# Patient Record
Sex: Male | Born: 1980 | Race: White | Hispanic: No | Marital: Single | Smoking: Never smoker
Health system: Southern US, Community
[De-identification: ages and names within clinical notes are randomized; demographics above are authoritative.]

---
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
## 2003-10-25 ENCOUNTER — Ambulatory Visit (HOSPITAL_BASED_OUTPATIENT_CLINIC_OR_DEPARTMENT_OTHER): Admission: RE | Admit: 2003-10-25 | Discharge: 2003-10-25 | Payer: Self-pay

## 2003-10-25 ENCOUNTER — Ambulatory Visit (HOSPITAL_COMMUNITY): Admission: RE | Admit: 2003-10-25 | Discharge: 2003-10-25 | Payer: Self-pay

## 2016-12-07 ENCOUNTER — Encounter (HOSPITAL_BASED_OUTPATIENT_CLINIC_OR_DEPARTMENT_OTHER): Payer: Self-pay | Admitting: Emergency Medicine

## 2016-12-07 ENCOUNTER — Emergency Department (HOSPITAL_BASED_OUTPATIENT_CLINIC_OR_DEPARTMENT_OTHER): Payer: Managed Care, Other (non HMO)

## 2016-12-07 ENCOUNTER — Emergency Department (HOSPITAL_BASED_OUTPATIENT_CLINIC_OR_DEPARTMENT_OTHER)
Admission: EM | Admit: 2016-12-07 | Discharge: 2016-12-07 | Disposition: A | Discharge status: Home / Self Care | Payer: Managed Care, Other (non HMO) | Attending: Emergency Medicine | Admitting: Emergency Medicine

## 2016-12-07 DIAGNOSIS — R1031 Right lower quadrant pain: Secondary | ICD-10-CM | POA: Diagnosis not present

## 2016-12-07 LAB — CBC WITH DIFFERENTIAL/PLATELET
BASOS ABS: 0 10*3/uL (ref 0.0–0.1)
Basophils Relative: 1 %
EOS PCT: 7 %
Eosinophils Absolute: 0.3 10*3/uL (ref 0.0–0.7)
HEMATOCRIT: 40.9 % (ref 39.0–52.0)
HEMOGLOBIN: 14.3 g/dL (ref 13.0–17.0)
LYMPHS ABS: 1 10*3/uL (ref 0.7–4.0)
LYMPHS PCT: 25 %
MCH: 31.8 pg (ref 26.0–34.0)
MCHC: 35 g/dL (ref 30.0–36.0)
MCV: 91.1 fL (ref 78.0–100.0)
Monocytes Absolute: 0.4 10*3/uL (ref 0.1–1.0)
Monocytes Relative: 11 %
NEUTROS ABS: 2.2 10*3/uL (ref 1.7–7.7)
NEUTROS PCT: 56 %
Platelets: 149 10*3/uL — ABNORMAL LOW (ref 150–400)
RBC: 4.49 MIL/uL (ref 4.22–5.81)
RDW: 12 % (ref 11.5–15.5)
WBC: 3.8 10*3/uL — AB (ref 4.0–10.5)

## 2016-12-07 LAB — BASIC METABOLIC PANEL
ANION GAP: 7 (ref 5–15)
BUN: 21 mg/dL — ABNORMAL HIGH (ref 6–20)
CHLORIDE: 104 mmol/L (ref 101–111)
CO2: 28 mmol/L (ref 22–32)
Calcium: 9.7 mg/dL (ref 8.9–10.3)
Creatinine, Ser: 0.99 mg/dL (ref 0.61–1.24)
GFR calc Af Amer: >60 mL/min (ref >60)
Glucose, Bld: 93 mg/dL (ref 65–99)
Potassium: 4.1 mmol/L (ref 3.5–5.1)
SODIUM: 139 mmol/L (ref 135–145)

## 2016-12-07 MED ORDER — IOPAMIDOL (ISOVUE-300) INJECTION 61%
100.0000 mL | Freq: Once | INTRAVENOUS | Status: AC | PRN
  Administered 2016-12-07: 100 mL via INTRAVENOUS

## 2016-12-07 NOTE — Discharge Instructions (Addendum)
Read the information below.  You may return to the Emergency Department at any time for worsening condition or any new symptoms that concern you.  If you develop high fevers, worsening abdominal pain, uncontrolled vomiting, or are unable to tolerate fluids by mouth, return to the ER for a recheck.   °

## 2016-12-07 NOTE — ED Provider Notes (Signed)
Sign out from [place] PA-C  Sent here to r/o appenidicitis  2-3 weeks of RLQ pain radiating to R testicle, associated nausea; intermittent  McBurney's point tenderness  CTAP, urinalysis pending  If negative, discharge home  Urine was negative at urgent care. CT abdomen pelvis is also negative for acute abnormality. Patient to follow up with PCP if symptoms are continuing. Strict return precautions discussed. Patient understands and agrees with plan. Patient vitals stable throughout ED course discharged in satisfactory condition.   Emi HolesLaw, Tory Septer M, PA-C 12/07/16 1800    Arby BarrettePfeiffer, Marcy, MD 12/07/16 2328

## 2016-12-07 NOTE — ED Triage Notes (Signed)
Patient states that he went to [facility] urgent care and they sent him here to be evaluated for possible Appendacitis. The patient reports that he has had right lower quadrant pain x 2 -3 weeks, with nausea. Getting worse every day

## 2016-12-07 NOTE — ED Provider Notes (Signed)
[facility] Note   CSN: [id number] Arrival date & time: 12/07/16  1348     History   Chief Complaint Chief Complaint  Patient presents with  . Abdominal Pain    HPI Bobby Gay is a 36 y.o. male.  HPI   Patient sent from urgent care to rule out appendicitis.  Pt with RLQ abdominal pain x 2-3 weeks, associated nausea.  Pain radiates into the right testicle.  Worse with activity.  Denies fevers, chills, myalgias, N/V, bowel changes, urinary symptoms, testicular swelling, penile discharge, genital lesions.  Has past surgical history of left inguinal hernia repair.    History reviewed. No pertinent past medical history.  There are no active problems to display for this patient.   History reviewed. No pertinent surgical history.     Home Medications    Prior to Admission medications   Not on File    Family History History reviewed. No pertinent family history.  Social History Social History  Substance Use Topics  . Smoking status: Never Smoker  . Smokeless tobacco: Never Used  . Alcohol use No     Allergies   Patient has no known allergies.   Review of Systems Review of Systems  All other systems reviewed and are negative.    Physical Exam Updated Vital Signs BP 111/68 (BP Location: Right Arm)   Pulse 64   Temp 98.3 F (36.8 C) (Oral)   Resp 15   Ht 6\' 1"  (1.854 m)   Wt 67.6 kg (149 lb)   SpO2 100%   BMI 19.66 kg/m   Physical Exam  Constitutional: He appears well-developed and well-nourished. No distress.  HENT:  Head: Normocephalic and atraumatic.  Neck: Neck supple.  Cardiovascular: Normal rate and regular rhythm.   Pulmonary/Chest: Effort normal and breath sounds normal. No respiratory distress. He has no wheezes. He has no rales.  Abdominal: Soft. He exhibits no distension and no mass. There is tenderness in the right lower quadrant. There is tenderness at McBurney's point. There is no rebound and no  guarding.  Genitourinary: Testes normal. Right testis shows no mass, no swelling and no tenderness. Right testis is descended. Left testis shows no mass, no swelling and no tenderness. Left testis is descended.  Neurological: He is alert. He exhibits normal muscle tone.  Skin: He is not diaphoretic.  Nursing note and vitals reviewed.    ED Treatments / Results  Labs (all labs ordered are listed, but only abnormal results are displayed) Labs Reviewed  BASIC METABOLIC PANEL - Abnormal; Notable for the following:       Result Value   BUN 21 (*)    All other components within normal limits  CBC WITH DIFFERENTIAL/PLATELET - Abnormal; Notable for the following:    WBC 3.8 (*)    Platelets 149 (*)    All other components within normal limits  URINALYSIS, ROUTINE W REFLEX MICROSCOPIC    EKG  EKG Interpretation None       Radiology No results found.  Procedures Procedures (including critical care time)  Medications Ordered in ED Medications  iopamidol (ISOVUE-300) 61 % injection 100 mL (100 mLs Intravenous Contrast Given 12/07/16 1702)     Initial Impression / Assessment and Plan / ED Course  I have reviewed the triage vital signs and the nursing notes.  Pertinent labs & imaging results that were available during my care of the patient were reviewed by me and considered in my medical decision making (see chart  for details).     Afebrile nontoxic patient with 2-3 weeks of RLQ abdominal pain, nausea.  No other symptoms.  No fevers.  Labs unremarkable.  UA, CT abd/pelvis pending at change of shift.  Glenford Bayley, PA-C, assumes care pending remainder of workup.    Final Clinical Impressions(s) / ED Diagnoses   Final diagnoses:  Right lower quadrant abdominal pain    New Prescriptions New Prescriptions   No medications on file     Trixie Dredge, Cordelia Poche 12/07/16 1710    Blane Ohara, MD 12/10/16 [phone]

## 2018-06-16 IMAGING — CT CT ABD-PELV W/ CM
2 of 4 series · 16 of 46 positions shown, 18 images · IV contrast (APPLIED)
Comparison: None.

CLINICAL DATA: Right lower quadrant abdominal pain.

EXAM:
CT ABDOMEN AND PELVIS WITH CONTRAST
TECHNIQUE: Multidetector CT imaging of the abdomen and pelvis was performed
using the standard protocol following bolus administration of
intravenous contrast.
CONTRAST:  100mL [id number] IOPAMIDOL (CA1366-WJJ) INJECTION 61%

[Series 2: axial st · axial · 0.74mm/px · z∈[+638,+1048]mm · 13 of 90 slices shown, 15 images]
[im 4/90  soft-tissue]
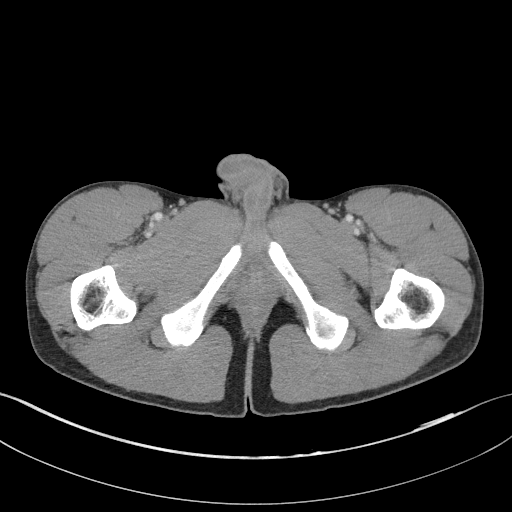
[im 4/90  bone]
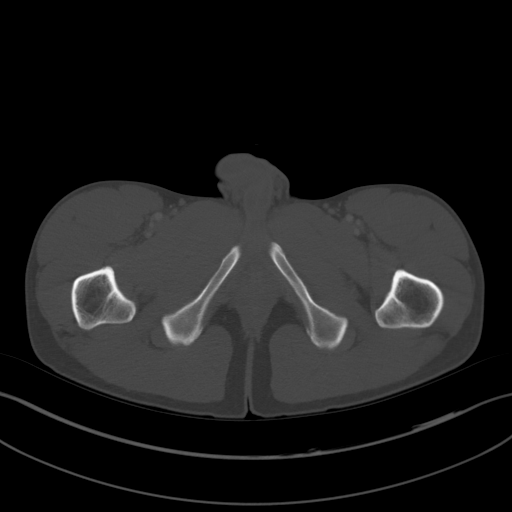
[im 12/90  soft-tissue]
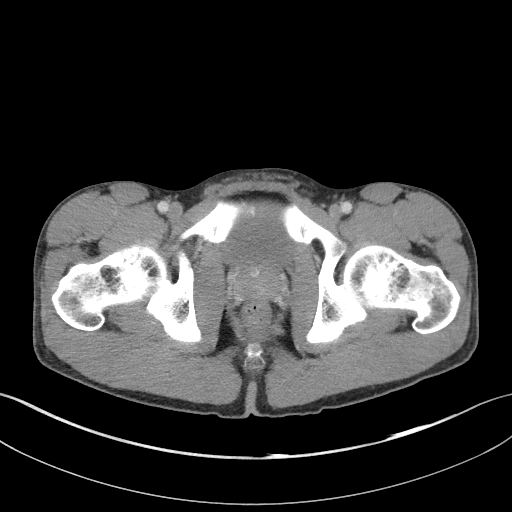
[im 19/90  soft-tissue]
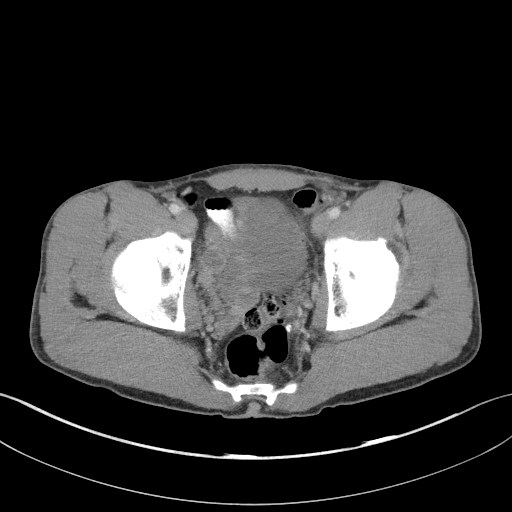
[im 26/90  soft-tissue]
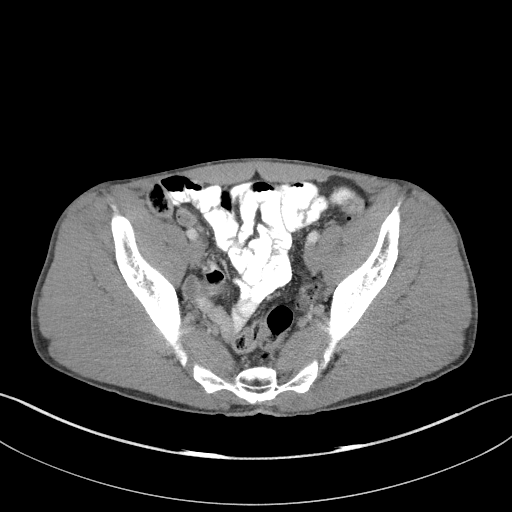
[im 30/90  soft-tissue]
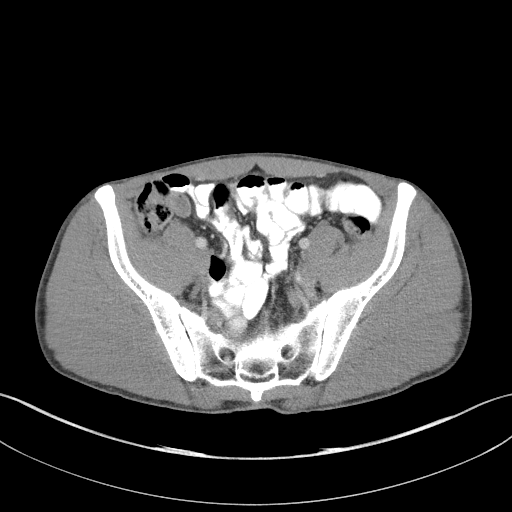
[im 38/90  soft-tissue]
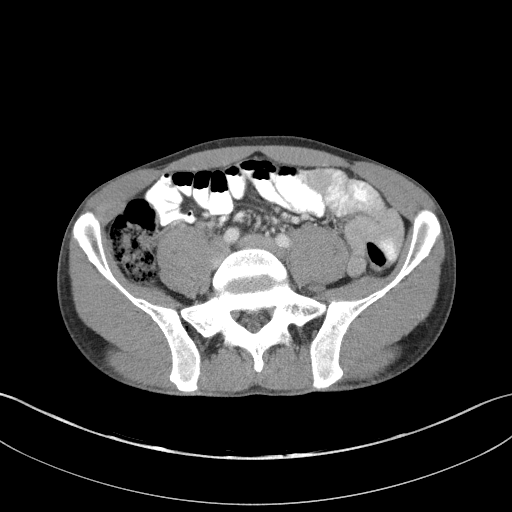
[im 45/90  soft-tissue]
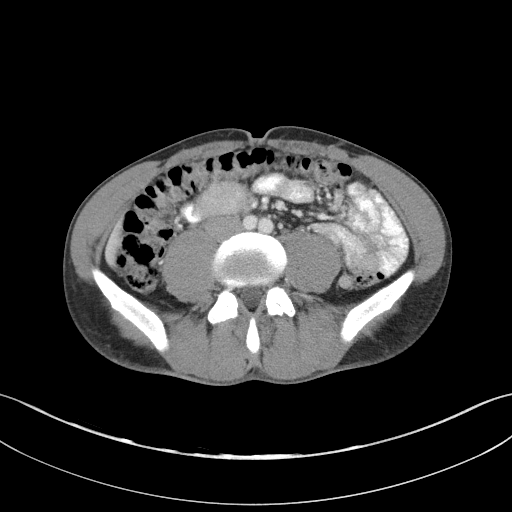
[im 52/90  soft-tissue]
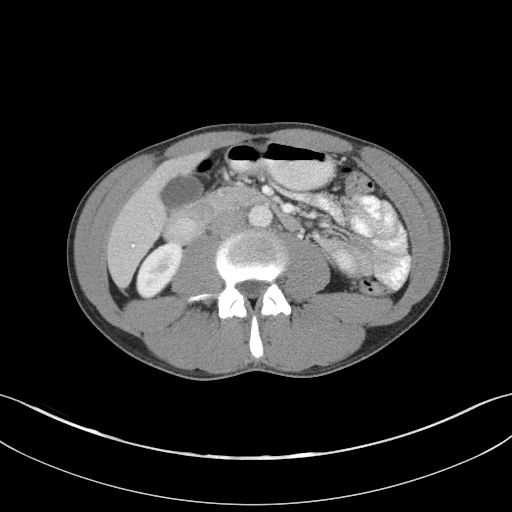
[im 60/90  soft-tissue]
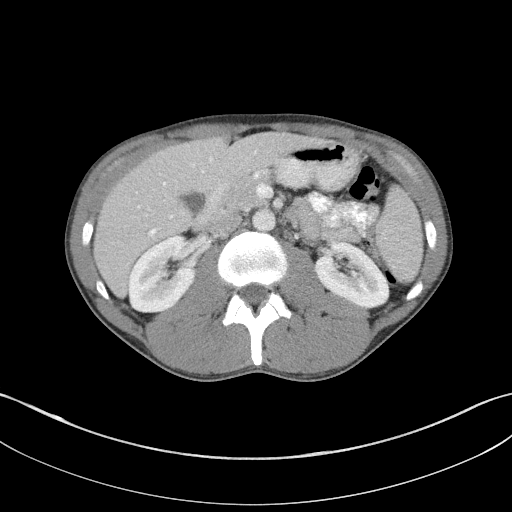
[im 60/90  bone]
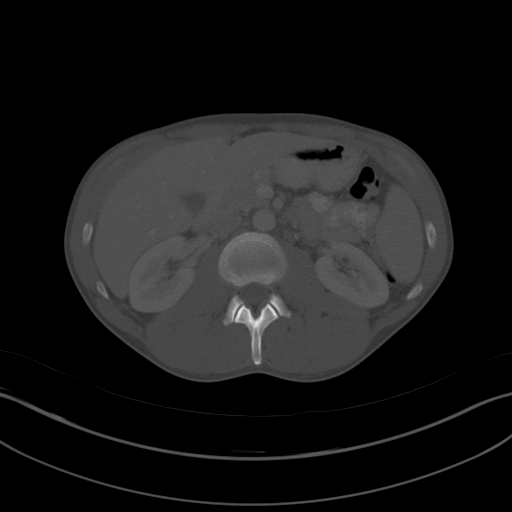
[im 64/90  soft-tissue]
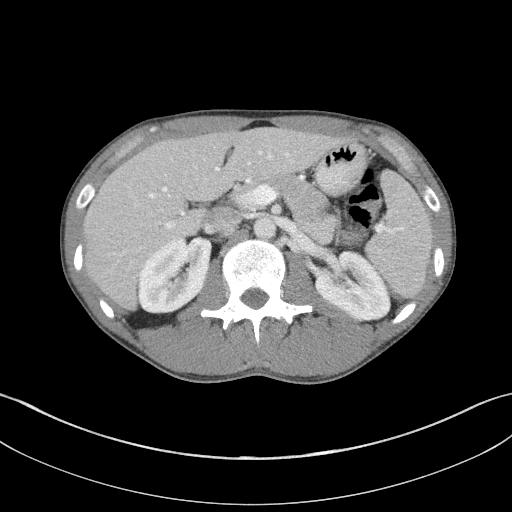
[im 71/90  soft-tissue]
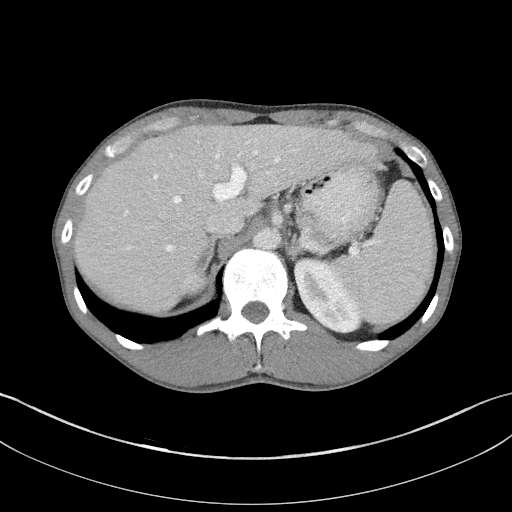
[im 78/90  soft-tissue]
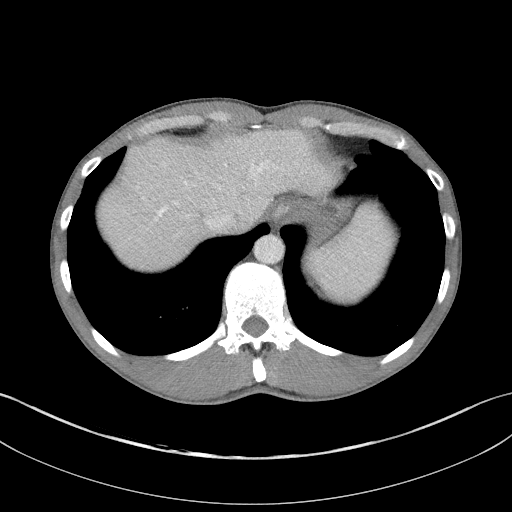
[im 86/90  soft-tissue]
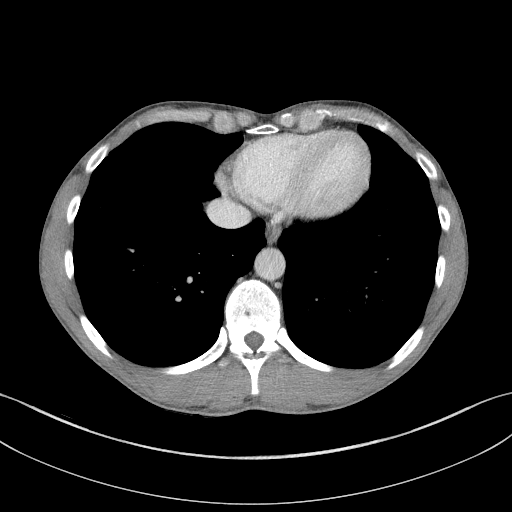

[Series 5: coronal st · coronal · 0.73mm/px · 3 of 71 slices shown]
[im 24/71  soft-tissue]
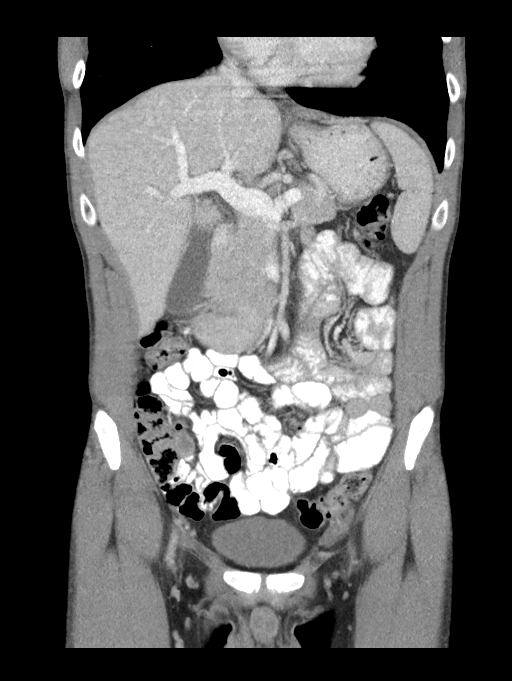
[im 32/71  soft-tissue]
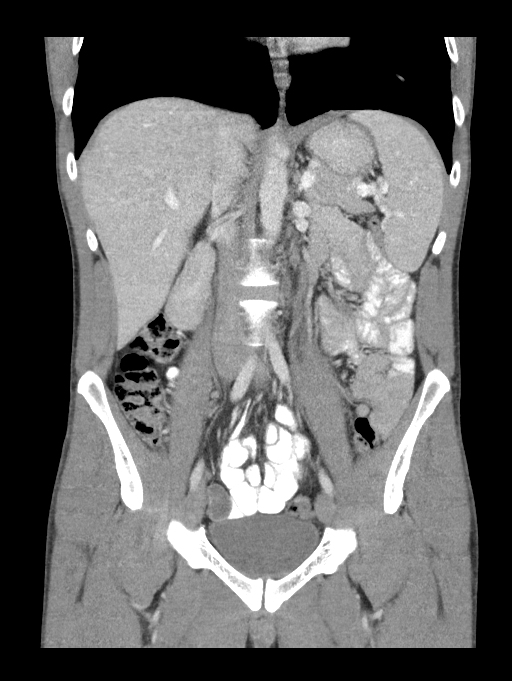
[im 39/71  soft-tissue]
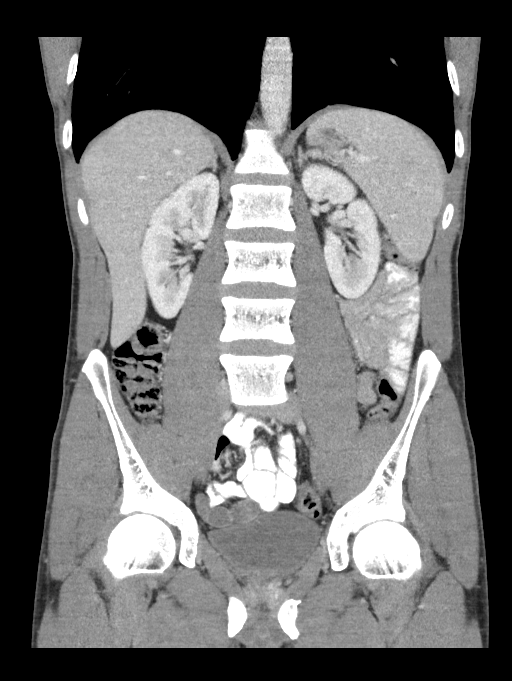

[16 of 46 positions shown; findings below may reference images not displayed]

FINDINGS: Lower chest: No acute abnormality.

Hepatobiliary: No focal liver abnormality is seen. No gallstones,
gallbladder wall thickening, or biliary dilatation.

Pancreas: Unremarkable. No pancreatic ductal dilatation or
surrounding inflammatory changes.

Spleen: Normal in size without focal abnormality.

Adrenals/Urinary Tract: Adrenal glands are unremarkable. Kidneys are
normal, without renal calculi, focal lesion, or hydronephrosis.
Bladder is unremarkable.

Stomach/Bowel: No evidence of bowel obstruction or inflammation. The
appendix is normal and there is no evidence of acute appendicitis.
No free air or focal abscess identified.

Vascular/Lymphatic: No significant vascular findings are present. No
enlarged abdominal or pelvic lymph nodes.

Reproductive: Unremarkable.

Other: No abdominal wall hernia or abnormality. No abdominopelvic
ascites.

Musculoskeletal: No acute or significant osseous findings.
IMPRESSION: No evidence of acute appendicitis or other acute process in the
abdomen or pelvis.
